# Patient Record
Sex: Male | Born: 2000 | Hispanic: No | Marital: Single | Smoking: Never smoker
Health system: Southern US, Community
[De-identification: ages and names within clinical notes are randomized; demographics above are authoritative.]

## PROBLEM LIST (undated history)

## (undated) DIAGNOSIS — T7840XA Allergy, unspecified, initial encounter: Secondary | ICD-10-CM

## (undated) HISTORY — DX: Allergy, unspecified, initial encounter: T78.40XA

---
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
## 2020-02-26 ENCOUNTER — Other Ambulatory Visit: Payer: Self-pay

## 2020-02-26 ENCOUNTER — Ambulatory Visit: Payer: BC Managed Care – PPO | Admitting: Medical

## 2020-02-26 VITALS — BP 119/71 | HR 81 | Temp 97.0°F | Resp 18 | Ht 72.0 in | Wt 191.0 lb

## 2020-02-26 DIAGNOSIS — L089 Local infection of the skin and subcutaneous tissue, unspecified: Secondary | ICD-10-CM

## 2020-02-26 DIAGNOSIS — L600 Ingrowing nail: Secondary | ICD-10-CM | POA: Diagnosis not present

## 2020-02-26 MED ORDER — CEPHALEXIN 500 MG PO CAPS: 500.0000 mg | ORAL_CAPSULE | Freq: Two times a day (BID) | ORAL | 0 refills | Status: DC

## 2020-02-26 NOTE — Patient Instructions (Addendum)
You do appear to have ingrown toe nail with infection. Want you to do warm salt water soaks twice daily, start keflex antibiotic and also use low dose ibuprofen 400-600 mg every 8 hours.  Update me on Friday morning if toe feels better. If not then will go ahead and refer you to podiatrist.   Follow up in 7-10 days for recheck if we don't need to refer you to podiatrist

## 2020-02-26 NOTE — Progress Notes (Signed)
Subjective:    Patient ID: Anthony Newton, male    DOB: 2001/04/15, 19 y.o.   MRN: [id number]  HPI  Pt in with rt great toe pain/lateral aspect swollen and tender for about 2 weeks ago. Pt states applying alcohol and neosporin to area.  No fever, no chills and no discharge.    Review of Systems  Constitutional: Negative for chills, diaphoresis, fatigue and fever.  Respiratory: Negative for cough, chest tightness, shortness of breath and wheezing.   Cardiovascular: Negative for chest pain and palpitations.  Skin: Negative for rash.       Rt toe pain.  Neurological: Negative for dizziness, speech difficulty, weakness, numbness and headaches.  Hematological: Negative for adenopathy. Does not bruise/bleed easily.  Psychiatric/Behavioral: Negative for behavioral problems and decreased concentration.    Past Medical History:  Diagnosis Date  . Allergy      Social History   Socioeconomic History  . Marital status: Single    Spouse name: Not on file  . Number of children: Not on file  . Years of education: Not on file  . Highest education level: Not on file  Occupational History  . Not on file  Tobacco Use  . Smoking status: Never Smoker  . Smokeless tobacco: Never Used  Substance and Sexual Activity  . Alcohol use: No  . Drug use: No  . Sexual activity: Not on file  Other Topics Concern  . Not on file  Social History Narrative  . Not on file   Social Determinants of Health   Financial Resource Strain:   . Difficulty of Paying Living Expenses:   Food Insecurity:   . Worried About Programme researcher, broadcasting/film/video in the Last Year:   . Barista in the Last Year:   Transportation Needs:   . Freight forwarder (Medical):   Marland Kitchen Lack of Transportation (Non-Medical):   Physical Activity:   . Days of Exercise per Week:   . Minutes of Exercise per Session:   Stress:   . Feeling of Stress :   Social Connections:   . Frequency of Communication with Friends and  Family:   . Frequency of Social Gatherings with Friends and Family:   . Attends Religious Services:   . Active Member of Clubs or Organizations:   . Attends Banker Meetings:   Marland Kitchen Marital Status:   Intimate Partner Violence:   . Fear of Current or Ex-Partner:   . Emotionally Abused:   Marland Kitchen Physically Abused:   . Sexually Abused:     No past surgical history on file.  Family History  Problem Relation Age of Onset  . Hypertension Mother   . Hypertension Father   . Breast cancer Maternal Grandmother     Not on File  Current Outpatient Medications on File Prior to Visit  Medication Sig Dispense Refill  . cephALEXin (KEFLEX) 500 MG capsule Take 1 capsule (500 mg total) by mouth 2 (two) times daily. (Patient not taking: Reported on 02/26/2020) 14 capsule 0  . moxifloxacin (VIGAMOX) 0.5 % ophthalmic solution Place 1 drop into the left eye 3 (three) times daily. If not covered well/too expensive then can switch to tobrex solution. 5 ml 1-2 drops every 6-8 hours. But please let us know. Thanks. (Patient not taking: Reported on 02/26/2020) 3 mL 0   No current facility-administered medications on file prior to visit.    BP 119/71 (BP Location: Left Arm, Patient Position: Sitting, Cuff Size: Large)  Pulse 81   Temp (!) 97 F (36.1 C) (Temporal)   Resp 18   Ht 6' (1.829 m)   Wt 191 lb (86.6 kg)   SpO2 100%   BMI 25.90 kg/m       Objective:   Physical Exam  General- no acute, no distress, pleasant. Rt foot- great toe lateral aspect swollen and tender mild. No dc.      Assessment & Plan:  You do appear to have ingrown toe nail with infection. Want you to do warm salt water soaks twice daily, start keflex antibiotic and also use low dose ibuprofen 400-600 mg evrery 8 hours.  Update me on Friday morning if toe feels better. If not then will go ahead and refer you to podiatrist.   Follow up in 7-10 days for recheck if we don't need to refer you to podiatrist   Time  spent with patient today was 20  minutes which consisted of  discussing diagnosis,  Treatment, plan going forward/potential referral  and documentation.

## 2020-11-27 ENCOUNTER — Ambulatory Visit (INDEPENDENT_AMBULATORY_CARE_PROVIDER_SITE_OTHER): Payer: BLUE CROSS/BLUE SHIELD | Admitting: Medical

## 2020-11-27 ENCOUNTER — Other Ambulatory Visit: Payer: Self-pay

## 2020-11-27 ENCOUNTER — Ambulatory Visit (HOSPITAL_BASED_OUTPATIENT_CLINIC_OR_DEPARTMENT_OTHER)
Admission: RE | Admit: 2020-11-27 | Discharge: 2020-11-27 | Disposition: A | Discharge status: Home / Self Care | Payer: BLUE CROSS/BLUE SHIELD | Source: Ambulatory Visit | Attending: Medical | Admitting: Medical

## 2020-11-27 ENCOUNTER — Encounter: Payer: Self-pay | Admitting: Medical

## 2020-11-27 VITALS — BP 100/80 | HR 84 | Temp 98.3°F | Resp 18 | Ht 72.0 in | Wt 196.2 lb

## 2020-11-27 DIAGNOSIS — M25571 Pain in right ankle and joints of right foot: Secondary | ICD-10-CM | POA: Diagnosis not present

## 2020-11-27 NOTE — Patient Instructions (Signed)
You have 1 week of new onset moderate ankle pain.  No particular injury but you do report walking a lot at work.  Pain is in the talofibular area, arch and on top of foot.  We will get x-ray of ankle today and recommend that you use Ace wrap day.  Ace wrap applied during office visit.  Recommend using ibuprofen 400 mg every 8 hours as needed.  Follow x-ray results and response to above treatment.  If pain persist would refer to sports medicine.  Follow-up in 7 to 10 days or as needed.

## 2020-11-27 NOTE — Progress Notes (Signed)
   Subjective:    Patient ID: Anthony Newton, male    DOB: [birth year], 19 y.o.   MRN: [id number]  HPI  Pt in with rt ankle pain. Pain for about one week or more.   No injury or fall. Just states started hurting. Pt states walks a lot at work but no exercise such as jogging or gong to gym.  Pt does not report any specific significant pain to ankle in past.  Pt notes some pain in lateral ankle, top and arch.      Review of Systems  Constitutional: Negative for chills, fatigue and fever.  Musculoskeletal:       Right ankle and foot pain.       Objective:   Physical Exam  General-no acute distress Right ankle-mild talofibular region tenderness to palpation.  Faint tenderness on  palpation on proximal aspect/ankle junction.  No obvious tenderness to palpation of arch.  No obvious swelling to foot and no warmth.  Pulses intact.      Assessment & Plan:  You have 1 week of new onset moderate ankle pain.  No particular injury but you do report walking a lot at work.  Pain is in the talofibular area, arch and on top of foot.  We will get x-ray of ankle today and recommend that you use Ace wrap day.  Ace wrap applied during office visit.  Recommend using ibuprofen 400 mg every 8 hours as needed.  Follow x-ray results and response to above treatment.  If pain persist would refer to sports medicine.  Follow-up in 7 to 10 days or as needed.

## 2020-12-02 ENCOUNTER — Telehealth: Payer: Self-pay | Admitting: Family Medicine

## 2020-12-02 NOTE — Telephone Encounter (Signed)
  Patient seen by Kristine Garbe on 11/27/2020 and advise to call office if ankle did not improve for referral. Patient states he is still in pain and would like a referral to orthopedic doctor.  Please advise

## 2020-12-02 NOTE — Addendum Note (Signed)
Addended by: Gwenevere Abbot on: 12/02/2020 11:31 AM   Modules accepted: Orders

## 2020-12-02 NOTE — Telephone Encounter (Signed)
I put in sports med referral . I think this will be quicker referral and Sports med can address nonnsurgical orthopedic problems often just as well and sometimes quicker referral. If he wants me to refer to ortho let me know.

## 2020-12-03 NOTE — Telephone Encounter (Signed)
Pt.notified

## 2020-12-11 ENCOUNTER — Ambulatory Visit: Payer: Self-pay

## 2020-12-11 ENCOUNTER — Encounter: Payer: Self-pay | Admitting: Family Medicine

## 2020-12-11 ENCOUNTER — Ambulatory Visit (INDEPENDENT_AMBULATORY_CARE_PROVIDER_SITE_OTHER): Payer: BC Managed Care – PPO | Admitting: Family Medicine

## 2020-12-11 ENCOUNTER — Other Ambulatory Visit: Payer: Self-pay

## 2020-12-11 VITALS — BP 116/80 | Ht 72.0 in | Wt 196.0 lb

## 2020-12-11 DIAGNOSIS — M84374A Stress fracture, right foot, initial encounter for fracture: Secondary | ICD-10-CM | POA: Diagnosis not present

## 2020-12-11 DIAGNOSIS — M79671 Pain in right foot: Secondary | ICD-10-CM

## 2020-12-11 NOTE — Progress Notes (Signed)
  Anthony Newton - 20 y.o. male MRN [id number]  Date of birth: Nov 30, 2000  SUBJECTIVE:  Including CC & ROS.  No chief complaint on file.   Anthony Newton is a 20 y.o. male that is presenting with right lateral midfoot pain.  The pain is ongoing over the past 2 to 3 weeks.  Seems to be worse after he has been working on his feet for a period of time.  Denies any injury or inciting event.  No history of injury or surgery.  Has not tried nothing for the pain..  Independent review of the right ankle x-ray from 2/9 shows possible effusion.   Review of Systems See HPI   HISTORY: Past Medical, Surgical, Social, and Family History Reviewed & Updated per EMR.   Pertinent Historical Findings include:  Past Medical History:  Diagnosis Date  . Allergy     No past surgical history on file.  Family History  Problem Relation Age of Onset  . Hypertension Mother   . Hypertension Father   . Breast cancer Maternal Grandmother     Social History   Socioeconomic History  . Marital status: Single    Spouse name: Not on file  . Number of children: Not on file  . Years of education: Not on file  . Highest education level: Not on file  Occupational History  . Not on file  Tobacco Use  . Smoking status: Never Smoker  . Smokeless tobacco: Never Used  Substance and Sexual Activity  . Alcohol use: No  . Drug use: No  . Sexual activity: Not on file  Other Topics Concern  . Not on file  Social History Narrative  . Not on file   Social Determinants of Health   Financial Resource Strain: Not on file  Food Insecurity: Not on file  Transportation Needs: Not on file  Physical Activity: Not on file  Stress: Not on file  Social Connections: Not on file  Intimate Partner Violence: Not on file     PHYSICAL EXAM:  VS: BP 116/80 (BP Location: Left Arm, Patient Position: Sitting, Cuff Size: Large)   Ht 6' (1.829 m)   Wt 196 lb (88.9 kg)   BMI 26.58 kg/m  Physical Exam Gen: NAD,  alert, cooperative with exam, well-appearing MSK:  Right left foot: No swelling or ecchymosis. Tenderness palpation of the midfoot. Normal range of motion. Normal strength resistance. Neurovascular intact  Limited ultrasound: Right foot, left foot:  Right foot:  No effusion of the ankle joint. No change at the base of the first metatarsal. Increased hyperemia over the medial cuneiform which could represent a stress fracture. No changes at the base of the second metatarsal.  Left foot: No effusion of the ankle joint. No change of the base of the second. No changes of the navicular bone. No changes of the medial cuneiform. No changes at the base of the second metatarsal  Summary: Findings would suggest a stress reaction versus stress fracture of the medial cuneiform.  Ultrasound and interpretation by Clare Gandy, MD    ASSESSMENT & PLAN:   Stress fracture of right foot Symptoms been ongoing for the past 2 weeks.  Has increased hyperemia over the cuneiform which could represent a stress fracture. -Counseled on home exercise therapy and supportive care. -Postop shoe with scaphoid pad. -Could consider physical therapy or further imaging.

## 2020-12-11 NOTE — Assessment & Plan Note (Signed)
Symptoms been ongoing for the past 2 weeks.  Has increased hyperemia over the cuneiform which could represent a stress fracture. -Counseled on home exercise therapy and supportive care. -Postop shoe with scaphoid pad. -Could consider physical therapy or further imaging.

## 2020-12-11 NOTE — Patient Instructions (Signed)
Nice to meet you Please try ice  Please try the shoe  Please use ibuprofen or tylenol as needed  We can try an insole in the left shoe while the other foot is healing   Please send me a message in MyChart with any questions or updates.  Please see me back in 3 weeks.   --Dr. Jordan Likes

## 2020-12-31 ENCOUNTER — Ambulatory Visit (INDEPENDENT_AMBULATORY_CARE_PROVIDER_SITE_OTHER): Payer: BC Managed Care – PPO | Admitting: Family Medicine

## 2020-12-31 ENCOUNTER — Other Ambulatory Visit: Payer: Self-pay

## 2020-12-31 DIAGNOSIS — M84374D Stress fracture, right foot, subsequent encounter for fracture with routine healing: Secondary | ICD-10-CM | POA: Diagnosis not present

## 2020-12-31 NOTE — Patient Instructions (Signed)
Good to see you Please try the insoles  We could try a custom orthotic in the future   Please send me a message in MyChart with any questions or updates.  Please see me back in 4 weeks.   --Dr. Jordan Likes

## 2020-12-31 NOTE — Assessment & Plan Note (Addendum)
Has had improvement with post op shoe  - counseled on home exercise therapy and supportive care - green sport insole and scaphoid pads.  - could consider custom orthotics or further imaging.

## 2020-12-31 NOTE — Progress Notes (Signed)
  Anthony Newton - 20 y.o. male MRN [id number]  Date of birth: Nov 09, 2000  SUBJECTIVE:  Including CC & ROS.  No chief complaint on file.   Anthony Newton is a 20 y.o. male that is following up for his right foot pain. Has done well with the post op shoe. No pain currently.    Review of Systems See HPI   HISTORY: Past Medical, Surgical, Social, and Family History Reviewed & Updated per EMR.   Pertinent Historical Findings include:  Past Medical History:  Diagnosis Date  . Allergy     No past surgical history on file.  Family History  Problem Relation Age of Onset  . Hypertension Mother   . Hypertension Father   . Breast cancer Maternal Grandmother     Social History   Socioeconomic History  . Marital status: Single    Spouse name: Not on file  . Number of children: Not on file  . Years of education: Not on file  . Highest education level: Not on file  Occupational History  . Not on file  Tobacco Use  . Smoking status: Never Smoker  . Smokeless tobacco: Never Used  Substance and Sexual Activity  . Alcohol use: No  . Drug use: No  . Sexual activity: Not on file  Other Topics Concern  . Not on file  Social History Narrative  . Not on file   Social Determinants of Health   Financial Resource Strain: Not on file  Food Insecurity: Not on file  Transportation Needs: Not on file  Physical Activity: Not on file  Stress: Not on file  Social Connections: Not on file  Intimate Partner Violence: Not on file     PHYSICAL EXAM:  VS: BP 110/80 (BP Location: Left Arm, Patient Position: Sitting, Cuff Size: Normal)   Ht 6' (1.829 m)   Wt 196 lb (88.9 kg)   BMI 26.58 kg/m  Physical Exam Gen: NAD, alert, cooperative with exam, well-appearing MSK:  Right foot:  No swelling or ecchymosis  Normal range of motion  No tenderness to palpation.  Neurovascularly intact       ASSESSMENT & PLAN:   Stress fracture of right foot Has had improvement with  post op shoe  - counseled on home exercise therapy and supportive care - green sport insole and scaphoid pads.  - could consider custom orthotics or further imaging.

## 2021-01-28 ENCOUNTER — Other Ambulatory Visit: Payer: Self-pay

## 2021-01-28 ENCOUNTER — Ambulatory Visit (INDEPENDENT_AMBULATORY_CARE_PROVIDER_SITE_OTHER): Payer: BC Managed Care – PPO | Admitting: Family Medicine

## 2021-01-28 ENCOUNTER — Encounter: Payer: Self-pay | Admitting: Family Medicine

## 2021-01-28 DIAGNOSIS — M84374D Stress fracture, right foot, subsequent encounter for fracture with routine healing: Secondary | ICD-10-CM | POA: Diagnosis not present

## 2021-01-28 NOTE — Assessment & Plan Note (Signed)
Has been doing well since then insoles and additional padding was added. -Counseled on home exercise therapy and supportive care. -Could consider custom orthotics.

## 2021-01-28 NOTE — Progress Notes (Signed)
  Anthony Newton - 20 y.o. male MRN [id number]  Date of birth: December 31, 2000  SUBJECTIVE:  Including CC & ROS.  No chief complaint on file.   [name] is a 20 y.o. male that is following up for his foot pain.  He has been doing well since getting the Green sport insoles and the additional padding.   Review of Systems See HPI   HISTORY: Past Medical, Surgical, Social, and Family History Reviewed & Updated per EMR.   Pertinent Historical Findings include:  Past Medical History:  Diagnosis Date  . Allergy     No past surgical history on file.  Family History  Problem Relation Age of Onset  . Hypertension Mother   . Hypertension Father   . Breast cancer Maternal Grandmother     Social History   Socioeconomic History  . Marital status: Single    Spouse name: Not on file  . Number of children: Not on file  . Years of education: Not on file  . Highest education level: Not on file  Occupational History  . Not on file  Tobacco Use  . Smoking status: Never Smoker  . Smokeless tobacco: Never Used  Substance and Sexual Activity  . Alcohol use: No  . Drug use: No  . Sexual activity: Not on file  Other Topics Concern  . Not on file  Social History Narrative  . Not on file   Social Determinants of Health   Financial Resource Strain: Not on file  Food Insecurity: Not on file  Transportation Needs: Not on file  Physical Activity: Not on file  Stress: Not on file  Social Connections: Not on file  Intimate Partner Violence: Not on file     PHYSICAL EXAM:  VS: BP 120/74 (BP Location: Left Arm, Patient Position: Sitting, Cuff Size: Normal)   Ht 6' (1.829 m)   Wt 196 lb (88.9 kg)   BMI 26.58 kg/m  Physical Exam Gen: NAD, alert, cooperative with exam, well-appearing    ASSESSMENT & PLAN:   Stress fracture of right foot Has been doing well since then insoles and additional padding was added. -Counseled on home exercise therapy and supportive  care. -Could consider custom orthotics.

## 2021-05-06 ENCOUNTER — Encounter: Payer: Self-pay | Admitting: Family Medicine

## 2021-05-06 ENCOUNTER — Ambulatory Visit (INDEPENDENT_AMBULATORY_CARE_PROVIDER_SITE_OTHER): Payer: BC Managed Care – PPO | Admitting: Family Medicine

## 2021-05-06 ENCOUNTER — Other Ambulatory Visit: Payer: Self-pay

## 2021-05-06 DIAGNOSIS — M84374D Stress fracture, right foot, subsequent encounter for fracture with routine healing: Secondary | ICD-10-CM

## 2021-05-06 NOTE — Progress Notes (Signed)
  [name] - 20 y.o. male MRN [id number]  Date of birth: Mar 22, 2001  SUBJECTIVE:  Including CC & ROS.  No chief complaint on file.   Anthony Newton is a 20 y.o. male that is following up for his right foot pain.  He has cut back on the amount of working that he is doing.  His previous temporary insoles have helped with his pain.   Review of Systems See HPI   HISTORY: Past Medical, Surgical, Social, and Family History Reviewed & Updated per EMR.   Pertinent Historical Findings include:  Past Medical History:  Diagnosis Date   Allergy     History reviewed. No pertinent surgical history.  Family History  Problem Relation Age of Onset   Hypertension Mother    Hypertension Father    Breast cancer Maternal Grandmother     Social History   Socioeconomic History   Marital status: Single    Spouse name: Not on file   Number of children: Not on file   Years of education: Not on file   Highest education level: Not on file  Occupational History   Not on file  Tobacco Use   Smoking status: Never   Smokeless tobacco: Never  Substance and Sexual Activity   Alcohol use: No   Drug use: No   Sexual activity: Not on file  Other Topics Concern   Not on file  Social History Narrative   Not on file   Social Determinants of Health   Financial Resource Strain: Not on file  Food Insecurity: Not on file  Transportation Needs: Not on file  Physical Activity: Not on file  Stress: Not on file  Social Connections: Not on file  Intimate Partner Violence: Not on file     PHYSICAL EXAM:  VS: Ht 6' (1.829 m)   Wt 196 lb (88.9 kg)   BMI 26.58 kg/m  Physical Exam Gen: NAD, alert, cooperative with exam, well-appearing   Patient was fitted for a standard, cushioned, semi-rigid orthotic. The orthotic was heated and afterward the patient stood on the orthotic blank positioned on the orthotic stand. The patient was positioned in subtalar neutral position and 10 degrees  of ankle dorsiflexion in a weight bearing stance. After completion of molding, a stable base was applied to the orthotic blank. The blank was ground to a stable position for weight bearing. Size: 12 Pairs: 2 Base: Blue EVA Additional Posting and Padding: None The patient ambulated these, and they were very comfortable.    ASSESSMENT & PLAN:   Stress fracture of right foot Denies any significant pain today.  Reports he is spending less and is not working as much. -Counseled on home exercise therapy and supportive care. -Orthotics today. -Could consider adding scaphoid pads.

## 2021-05-06 NOTE — Assessment & Plan Note (Signed)
Denies any significant pain today.  Reports he is spending less and is not working as much. -Counseled on home exercise therapy and supportive care. -Orthotics today. -Could consider adding scaphoid pads.

## 2021-09-01 IMAGING — DX DG ANKLE COMPLETE 3+V*R*
3 series · 3 of 3 positions shown · non-contrast
Comparison: None.

CLINICAL DATA: Diffuse right ankle pain, no known injury

EXAM:
RIGHT ANKLE - COMPLETE 3+ VIEW

[ankle ap]
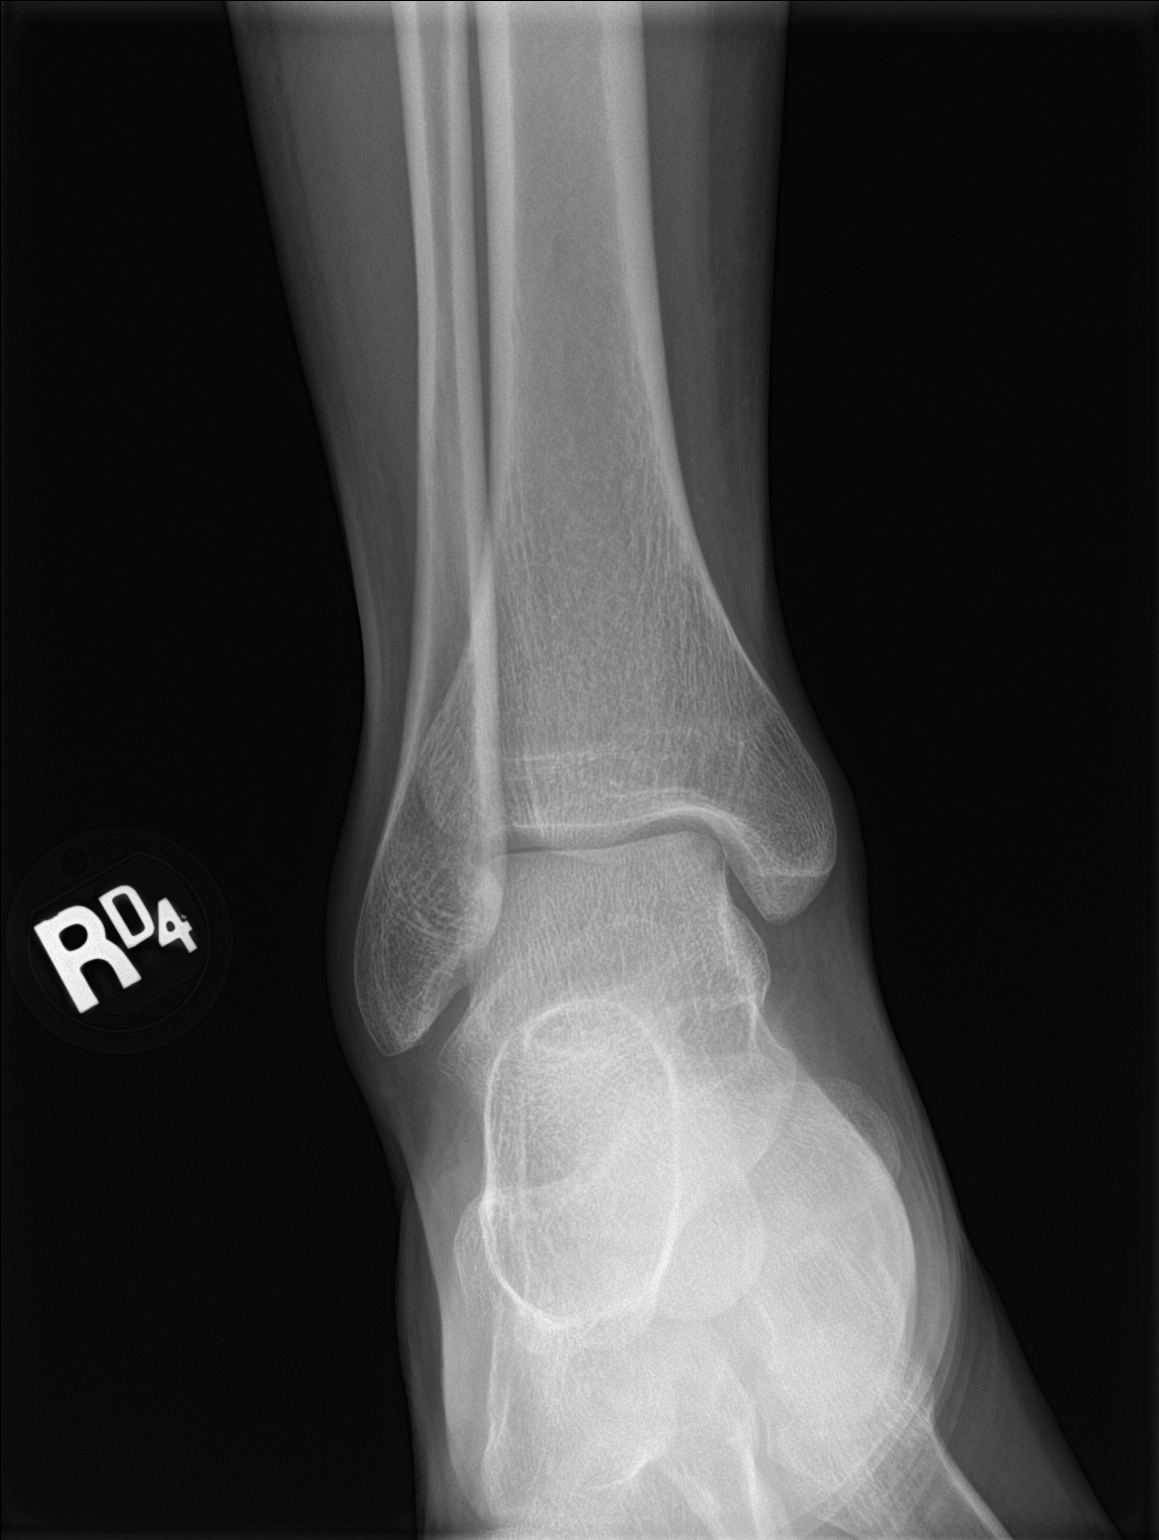

[ankle obl]
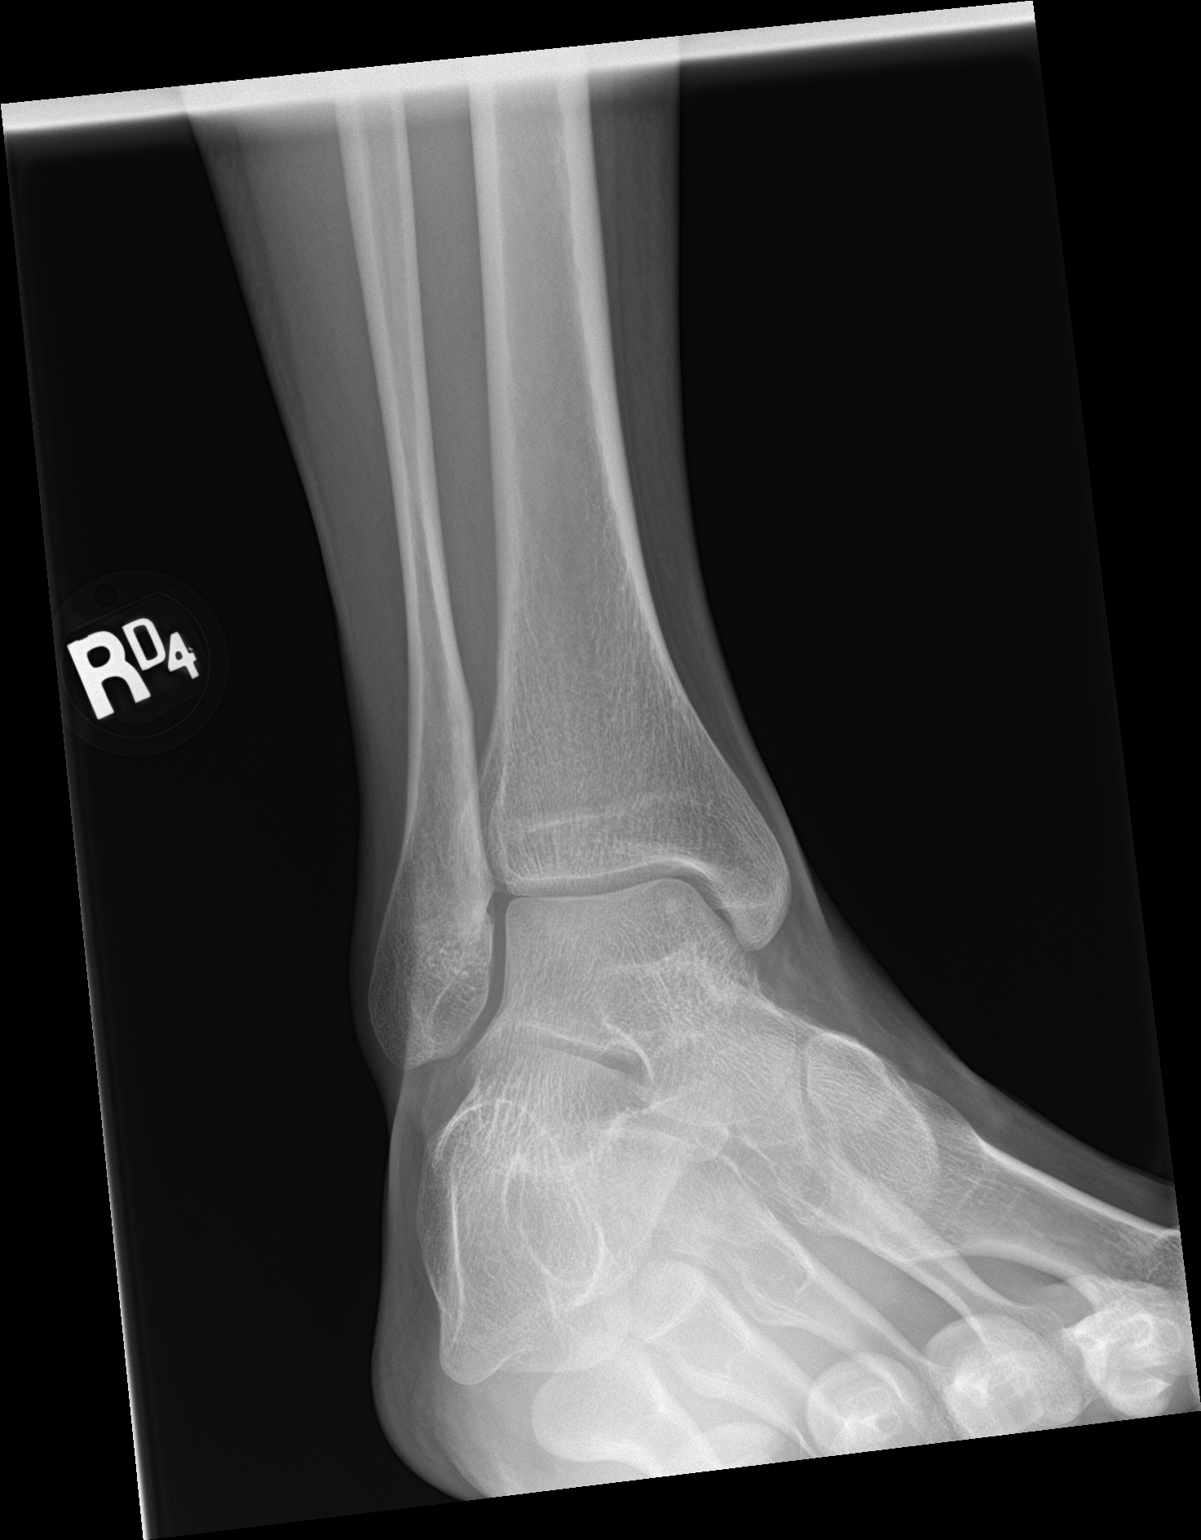

[ankle lat]
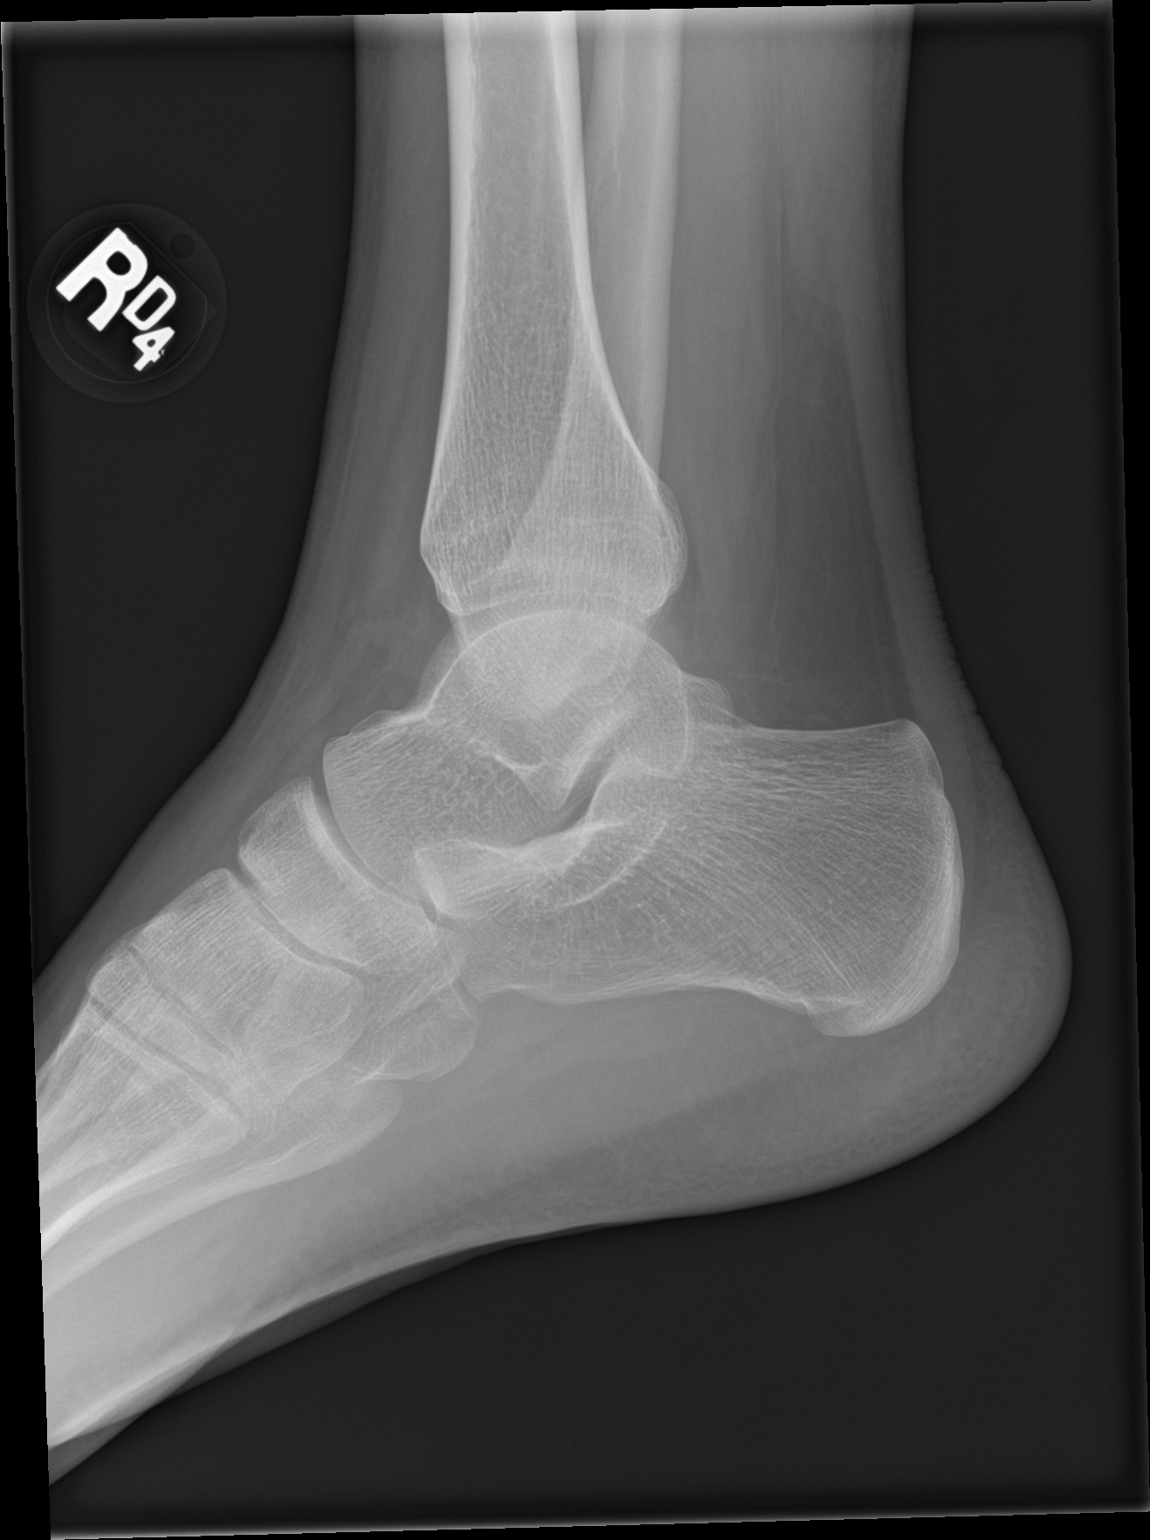

[3 of 3 positions shown; findings below may reference images not displayed]

FINDINGS: No significant swelling. Trace effusion. No acute bony abnormality.
Specifically, no fracture, subluxation, or dislocation. No evidence
of underlying arthropathy or age advanced degenerative change.
Normal bone mineralization.
IMPRESSION: No acute bony abnormality. Trace effusion.

## 2021-09-03 ENCOUNTER — Ambulatory Visit: Payer: BC Managed Care – PPO | Admitting: Podiatry

## 2021-09-08 ENCOUNTER — Encounter: Payer: Self-pay | Admitting: Podiatry

## 2021-09-08 ENCOUNTER — Ambulatory Visit (INDEPENDENT_AMBULATORY_CARE_PROVIDER_SITE_OTHER): Payer: BC Managed Care – PPO | Admitting: Podiatry

## 2021-09-08 ENCOUNTER — Ambulatory Visit (INDEPENDENT_AMBULATORY_CARE_PROVIDER_SITE_OTHER): Payer: BC Managed Care – PPO

## 2021-09-08 ENCOUNTER — Other Ambulatory Visit: Payer: Self-pay

## 2021-09-08 DIAGNOSIS — M79671 Pain in right foot: Secondary | ICD-10-CM

## 2021-09-08 DIAGNOSIS — M79672 Pain in left foot: Secondary | ICD-10-CM | POA: Diagnosis not present

## 2021-09-08 DIAGNOSIS — M779 Enthesopathy, unspecified: Secondary | ICD-10-CM | POA: Diagnosis not present

## 2021-09-08 NOTE — Progress Notes (Signed)
Subjective:   Patient ID: Anthony Newton, male   DOB: 20 y.o.   MRN: [id number]   HPI Patient presents stating his feet hurt and he had a stress fracture and had to go to the emergency room and had some temporary inserts putting them but they are not holding up his arch and he needs something for better arch support.  Patient does not smoke likes to be active and works weightbearing job cement floors   Review of Systems  All other systems reviewed and are negative.      Objective:  Physical Exam Vitals and nursing note reviewed.  Constitutional:      Appearance: He is well-developed.  Pulmonary:     Effort: Pulmonary effort is normal.  Musculoskeletal:        General: Normal range of motion.  Skin:    General: Skin is warm.  Neurological:     Mental Status: He is alert.    Neurovascular status intact muscle strength adequate range of motion adequate with moderate discomfort dorsum of both feet within the metatarsal shafts with moderate depression of the arch noted bilateral and found to have good digital perfusion well oriented x3     Assessment:  Acute tendinitis with possibility for stress fracture dorsal right foot and also discomfort left which stress fracture cannot be ruled out     Plan:  H&P reviewed patient and x-rays and at this point I have recommended a Berkley type orthotic device for deep heel seat offload weight off for his feet and take stress off.  Patient is casted for functional orthotics discussed ice therapy support therapy and shoe gear appropriateness for him to wear given his mild to moderate foot discomfort  X-rays were suspicious for fracture of the metatarsal shaft 3 left over right no acute fracture noted currently

## 2021-09-09 ENCOUNTER — Other Ambulatory Visit: Payer: Self-pay | Admitting: Podiatry

## 2021-09-09 DIAGNOSIS — M779 Enthesopathy, unspecified: Secondary | ICD-10-CM

## 2021-10-21 ENCOUNTER — Telehealth: Payer: Self-pay | Admitting: *Deleted

## 2021-10-21 ENCOUNTER — Telehealth: Payer: Self-pay | Admitting: Podiatry

## 2021-10-21 NOTE — Telephone Encounter (Signed)
Pt called stating it's been over a month and he hasn't heard back from the orthopaedic doctor. He would like to know if you can resend the referral. Please advise.

## 2021-10-28 ENCOUNTER — Ambulatory Visit: Payer: BC Managed Care – PPO

## 2021-10-28 ENCOUNTER — Other Ambulatory Visit: Payer: Self-pay

## 2021-10-28 DIAGNOSIS — M779 Enthesopathy, unspecified: Secondary | ICD-10-CM

## 2021-10-28 DIAGNOSIS — M79672 Pain in left foot: Secondary | ICD-10-CM

## 2021-10-28 NOTE — Progress Notes (Signed)
SITUATION: Reason for Visit: Fitting and Delivery of Custom Fabricated Foot Orthoses Patient Report: Patient reports comfort and is satisfied with device.  OBJECTIVE DATA: Patient History / Diagnosis:     ICD-10-CM   1. Foot pain, bilateral  M79.671    M79.672     2. Tendonitis  M77.9       Provided Device:  Functional foot orthotics  GOAL OF ORTHOSIS - Improve gait - Decrease energy expenditure - Improve Balance - Provide Triplanar stability of foot complex - Facilitate motion  ACTIONS PERFORMED Patient was fit with foot orthotics trimmed to shoe last. Patient tolerated fittign procedure. Device was modified as follows to better fit patient: - Toe plate was trimmed to shoe last  Patient was provided with verbal and written instruction and demonstration regarding donning, doffing, wear, care, proper fit, function, purpose, cleaning, and use of the orthosis and in all related precautions and risks and benefits regarding the orthosis.  Patient was also provided with verbal instruction regarding how to report any failures or malfunctions of the orthosis and necessary follow up care. Patient was also instructed to contact our office regarding any change in status that may affect the function of the orthosis.  Patient demonstrated independence with proper donning, doffing, and fit and verbalized understanding of all instructions.  PLAN: Patient is to follow up in one week or as necessary (PRN). All questions were answered and concerns addressed. Plan of care was discussed with and agreed upon by the patient.

## 2021-11-03 NOTE — Telephone Encounter (Signed)
Error message

## 2023-02-03 ENCOUNTER — Encounter: Payer: Self-pay | Admitting: *Deleted

## 2023-05-25 ENCOUNTER — Ambulatory Visit: Payer: BC Managed Care – PPO

## 2023-05-25 NOTE — Progress Notes (Signed)
Patient was here and new impressions taken for new CFO's.  Patient has not seen Dr. Since 2022, November and wants INS billed for new CFO's. I need to talk with Dr Charlsie Merles to see if he needs, to see Patient again.   Addison Bailey Cped, CFo, CFm

## 2023-06-08 NOTE — Progress Notes (Unsigned)
Spoke with Dr. Charlsie Merles he has Ok'd new pair of orthotics for patient   Patient was seen, measured for custom molded foot orthotics on 05/25/2023  Patient will benefit from CFO's as they will help provide total contact to MLA's helping to better distribute body weight across BIL feet greater reducing plantar pressure and pain and to also encourage FF and RF alignment  Patient was scanned items to be ordered and fit when in  Scans of impressions done today and sent to [facility] for fabrication  Addison Bailey Cped, CFo, CFm  Charges and financials need to be added

## 2023-06-24 ENCOUNTER — Ambulatory Visit (INDEPENDENT_AMBULATORY_CARE_PROVIDER_SITE_OTHER): Payer: BC Managed Care – PPO

## 2023-06-24 DIAGNOSIS — M79672 Pain in left foot: Secondary | ICD-10-CM | POA: Diagnosis not present

## 2023-06-24 DIAGNOSIS — M779 Enthesopathy, unspecified: Secondary | ICD-10-CM

## 2023-06-24 DIAGNOSIS — M79671 Pain in right foot: Secondary | ICD-10-CM | POA: Diagnosis not present

## 2023-06-24 NOTE — Progress Notes (Signed)
Patient presents today to pick up custom molded foot orthotics, diagnosed with tendinitis by Dr. Charlsie Merles .   Orthotics were dispensed and fit was satisfactory. Reviewed instructions for break-in and wear. Written instructions given to patient.  Patient will follow up as needed. Financials Signed today   Addison Bailey CPed, CFo, CFm

## 2023-07-27 ENCOUNTER — Ambulatory Visit: Payer: BC Managed Care – PPO | Admitting: Medical

## 2023-07-27 ENCOUNTER — Ambulatory Visit (HOSPITAL_BASED_OUTPATIENT_CLINIC_OR_DEPARTMENT_OTHER)
Admission: RE | Admit: 2023-07-27 | Discharge: 2023-07-27 | Disposition: A | Discharge status: Home / Self Care | Payer: BC Managed Care – PPO | Source: Ambulatory Visit | Attending: Medical | Admitting: Medical

## 2023-07-27 VITALS — BP 110/62 | HR 72 | Resp 18 | Ht 72.0 in | Wt 219.0 lb

## 2023-07-27 DIAGNOSIS — M79609 Pain in unspecified limb: Secondary | ICD-10-CM | POA: Insufficient documentation

## 2023-07-27 NOTE — Patient Instructions (Addendum)
1. Popliteal pain -will get [place] to rule out DVT. If negative the consider tendon pain or muscle strain to semitendinosis. If negative [place] can use tylenol 500 mg in combination with aleve over the counter. Also could use ace wrap or knee brace. If dvt will treat with appropitate med and make specialist referral. - US Venous Img Lower Unilateral Right; Future   If negative dvt and pain persisting despite above treatement for one week then refer to sports med MD or PT  Follow up 7-10 days or as needed

## 2023-07-27 NOTE — Progress Notes (Signed)
   Established Patient Office Visit  Subjective   Patient ID: ELDRICK PENICK, male    DOB: September 06, 2001  Age: 22 y.o. MRN: [id number]  Chief Complaint  Patient presents with   Leg Pain    Both     HPI    ROS    Objective:     BP 110/62   Pulse 72   Resp (!) 97   Ht 6' (1.829 m)   Wt 219 lb (99.3 kg)   BMI 29.70 kg/m    Physical Exam   No results found for any visits on 07/27/23.    The ASCVD Risk score (Arnett DK, et al., 2019) failed to calculate for the following reasons:   The 2019 ASCVD risk score is only valid for ages 15 to 33    Assessment & Plan:   Problem List Items Addressed This Visit   None Visit Diagnoses     Popliteal pain    -  Primary   Relevant Orders   US Venous Img Lower Unilateral Right       No follow-ups on file.    Maximino Sarin, CMA

## 2023-07-27 NOTE — Progress Notes (Signed)
   Subjective:    Patient ID: Anthony Newton, male    DOB: [birth year], 22 y.o.   MRN: [id number]  HPI  Pt in with rt leg pain for 3-4 days. Pt states pain came on working at [employer]. No fall or injury.    He notes pain after walking for 3 hours after waking and walking.  No sob or wheezing.  Pt has took ibuprofen 400 mg over the counter. He states did not help much.  Review of Systems See hpi    Objective:   Physical Exam  General- No acute distress. Pleasant patient. Neck- Full range of motion, no jvd Lungs- Clear, even and unlabored. Heart- regular rate and rhythm. Neurologic- CNII- XII grossly intact.  Rt leg- mild popliteal region pain on papation. Calf not swollen. Negative homans sign.     Assessment & Plan:   Patient Instructions  1. Popliteal pain -will get [place] to rule out DVT. If negative the consider tendon pain or muscle starni semitendinosis. If negative [place] can use tylenol 500 mg in combination with aleve over the counter. Also could use ace wrap or knee brace. If dvt will treat with appropitate med and make specialist referral. - US Venous Img Lower Unilateral Right; Future   If negative dvt and pain persisting despite above treatement for one week then refer to sports med MD or PT  Follow up 7-10 days or as needed   Pt cell [phone]

## 2024-05-02 ENCOUNTER — Ambulatory Visit (INDEPENDENT_AMBULATORY_CARE_PROVIDER_SITE_OTHER): Admitting: Medical

## 2024-05-02 VITALS — BP 118/90 | HR 94 | Temp 98.0°F | Resp 18 | Ht 73.0 in | Wt 226.0 lb

## 2024-05-02 DIAGNOSIS — Z0000 Encounter for general adult medical examination without abnormal findings: Secondary | ICD-10-CM

## 2024-05-02 DIAGNOSIS — Z23 Encounter for immunization: Secondary | ICD-10-CM

## 2024-05-02 LAB — COMPREHENSIVE METABOLIC PANEL WITH GFR
ALT: 62 U/L — ABNORMAL HIGH (ref 0–53)
AST: 29 U/L (ref 0–37)
Albumin: 5 g/dL (ref 3.5–5.2)
Alkaline Phosphatase: 56 U/L (ref 39–117)
BUN: 10 mg/dL (ref 6–23)
CO2: 31 meq/L (ref 19–32)
Calcium: 10.1 mg/dL (ref 8.4–10.5)
Chloride: 101 meq/L (ref 96–112)
Creatinine, Ser: 0.87 mg/dL (ref 0.40–1.50)
GFR: 120.86 mL/min (ref >60.00)
Glucose, Bld: 82 mg/dL (ref 70–99)
Potassium: 4.3 meq/L (ref 3.5–5.1)
Sodium: 139 meq/L (ref 135–145)
Total Bilirubin: 0.4 mg/dL (ref 0.2–1.2)
Total Protein: 7.6 g/dL (ref 6.0–8.3)

## 2024-05-02 LAB — CBC WITH DIFFERENTIAL/PLATELET
Basophils Absolute: 0 K/uL (ref 0.0–0.1)
Basophils Relative: 0.5 % (ref 0.0–3.0)
Eosinophils Absolute: 0.1 K/uL (ref 0.0–0.7)
Eosinophils Relative: 1.2 % (ref 0.0–5.0)
HCT: 46 % (ref 39.0–52.0)
Hemoglobin: 15 g/dL (ref 13.0–17.0)
Lymphocytes Relative: 23.3 % (ref 12.0–46.0)
Lymphs Abs: 2.1 K/uL (ref 0.7–4.0)
MCHC: 32.6 g/dL (ref 30.0–36.0)
MCV: 82 fl (ref 78.0–100.0)
Monocytes Absolute: 0.9 K/uL (ref 0.1–1.0)
Monocytes Relative: 9.7 % (ref 3.0–12.0)
Neutro Abs: 6 K/uL (ref 1.4–7.7)
Neutrophils Relative %: 65.3 % (ref 43.0–77.0)
Platelets: 290 K/uL (ref 150.0–400.0)
RBC: 5.61 Mil/uL (ref 4.22–5.81)
RDW: 13.4 % (ref 11.5–15.5)
WBC: 9.2 K/uL (ref 4.0–10.5)

## 2024-05-02 LAB — LIPID PANEL
Cholesterol: 181 mg/dL (ref 0–200)
HDL: 41.3 mg/dL (ref >39.00)
LDL Cholesterol: 109 mg/dL — ABNORMAL HIGH (ref 0–99)
NonHDL: 140.19
Total CHOL/HDL Ratio: 4
Triglycerides: 155 mg/dL — ABNORMAL HIGH (ref 0.0–149.0)
VLDL: 31 mg/dL (ref 0.0–40.0)

## 2024-05-02 NOTE — Progress Notes (Signed)
 Subjective:    Patient ID: Anthony Newton, male    DOB: Jun 22, 2001, 23 y.o.   MRN: [id number]  HPI  Pt in for wellness exam  Pt works at [employer] pick up. Pt not working out apart from walking his dogs in morning. Pt states eating healthy for most part. Non smoker. No alcohol. Caffeine/soda every once in a while.  Pt will get Td today.   Advised flu vaccine recommended sept or October    Review of Systems  Constitutional:  Negative for chills, fatigue and fever.  HENT:  Negative for congestion.   Respiratory:  Negative for cough, chest tightness and wheezing.   Cardiovascular:  Negative for chest pain and palpitations.  Gastrointestinal:  Negative for abdominal pain, constipation, nausea and vomiting.  Genitourinary:  Negative for dysuria and frequency.  Musculoskeletal:  Negative for back pain and myalgias.  Skin:  Negative for rash.  Neurological:  Negative for dizziness, weakness and numbness.  Hematological:  Negative for adenopathy. Does not bruise/bleed easily.  Psychiatric/Behavioral:  Negative for behavioral problems and dysphoric mood.     Past Medical History:  Diagnosis Date   Allergy      Social History   Socioeconomic History   Marital status: Single    Spouse name: Not on file   Number of children: Not on file   Years of education: Not on file   Highest education level: Not on file  Occupational History   Not on file  Tobacco Use   Smoking status: Never   Smokeless tobacco: Never  Substance and Sexual Activity   Alcohol use: No   Drug use: No   Sexual activity: Not on file  Other Topics Concern   Not on file  Social History Narrative   Not on file   Social Drivers of Health   Financial Resource Strain: Not on file  Food Insecurity: Not on file  Transportation Needs: Not on file  Physical Activity: Not on file  Stress: Not on file  Social Connections: Not on file  Intimate Partner Violence: Not on file    No past surgical  history on file.  Family History  Problem Relation Age of Onset   Hypertension Mother    Hypertension Father    Breast cancer Maternal Grandmother     No Known Allergies  No current outpatient medications on file prior to visit.   No current facility-administered medications on file prior to visit.    BP (!) 118/90   Pulse 94   Temp 98 F (36.7 C)   Resp 18   Ht 6' 1 (1.854 m)   Wt 226 lb (102.5 kg)   SpO2 95%   BMI 29.82 kg/m        Objective:   Physical Exam  General Mental Status- Alert. General Appearance- Not in acute distress.   Skin General: Color- Normal Color. Moisture- Normal Moisture.  Neck Carotid Arteries- Normal color. Moisture- Normal Moisture. No carotid bruits. No JVD.  Chest and Lung Exam Auscultation: Breath Sounds:-Normal.  Cardiovascular Auscultation:Rythm- Regular. Murmurs & Other Heart Sounds:Auscultation of the heart reveals- No Murmurs.  Abdomen Inspection:-Inspeection Normal. Palpation/Percussion:Note:No mass. Palpation and Percussion of the abdomen reveal- Non Tender, Non Distended + BS, no rebound or guarding.  Neurologic Cranial Nerve exam:- CN III-XII intact(No nystagmus), symmetric smile. Strength:- 5/5 equal and symmetric strength both upper and lower extremities.   Skin- small hyperpigmented area left lower back where tick was attached 6 years ago.(Advised post inflamatory hyperpigmentation.  Assessment & Plan:   For you wellness exam today I have ordered cbc, cmp and lipid panel.  Vaccine given today Td  Recommend exercise and healthy diet.  We will let you know lab results as they come in.  Follow up date appointment will be determined after lab review.    Sierra Spargo, PA-C

## 2024-05-02 NOTE — Addendum Note (Signed)
 Addended by: DORLENE CHIQUITA RAMAN on: 05/02/2024 01:04 PM   Modules accepted: Orders

## 2024-05-02 NOTE — Patient Instructions (Addendum)
 For you wellness exam today I have ordered cbc, cmp and lipid panel.  Vaccine given today Td  Recommend exercise and healthy diet.  We will let you know lab results as they come in.  Follow up date appointment will be determined after lab review.    Preventive Care 73-24 Years Old, Male Preventive care refers to lifestyle choices and visits with your health care provider that can promote health and wellness. Preventive care visits are also called wellness exams. What can I expect for my preventive care visit? Counseling During your preventive care visit, your health care provider may ask about your: Medical history, including: Past medical problems. Family medical history. Current health, including: Emotional well-being. Home life and relationship well-being. Sexual activity. Lifestyle, including: Alcohol, nicotine or tobacco, and drug use. Access to firearms. Diet, exercise, and sleep habits. Safety issues such as seatbelt and bike helmet use. Sunscreen use. Work and work Astronomer. Physical exam Your health care provider may check your: Height and weight. These may be used to calculate your BMI (body mass index). BMI is a measurement that tells if you are at a healthy weight. Waist circumference. This measures the distance around your waistline. This measurement also tells if you are at a healthy weight and may help predict your risk of certain diseases, such as type 2 diabetes and high blood pressure. Heart rate and blood pressure. Body temperature. Skin for abnormal spots. What immunizations do I need?  Vaccines are usually given at various ages, according to a schedule. Your health care provider will recommend vaccines for you based on your age, medical history, and lifestyle or other factors, such as travel or where you work. What tests do I need? Screening Your health care provider may recommend screening tests for certain conditions. This may include: Lipid and  cholesterol levels. Diabetes screening. This is done by checking your blood sugar (glucose) after you have not eaten for a while (fasting). Hepatitis B test. Hepatitis C test. HIV (human immunodeficiency virus) test. STI (sexually transmitted infection) testing, if you are at risk. Talk with your health care provider about your test results, treatment options, and if necessary, the need for more tests. Follow these instructions at home: Eating and drinking  Eat a healthy diet that includes fresh fruits and vegetables, whole grains, lean protein, and low-fat dairy products. Drink enough fluid to keep your urine pale yellow. Take vitamin and mineral supplements as recommended by your health care provider. Do not drink alcohol if your health care provider tells you not to drink. If you drink alcohol: Limit how much you have to 0-2 drinks a day. Know how much alcohol is in your drink. In the [place] one drink equals one 12 oz bottle of beer (355 mL), one 5 oz glass of wine (148 mL), or one 1 oz glass of hard liquor (44 mL). Lifestyle Brush your teeth every morning and night with fluoride toothpaste. Floss one time each day. Exercise for at least 30 minutes 5 or more days each week. Do not use any products that contain nicotine or tobacco. These products include cigarettes, chewing tobacco, and vaping devices, such as e-cigarettes. If you need help quitting, ask your health care provider. Do not use drugs. If you are sexually active, practice safe sex. Use a condom or other form of protection to prevent STIs. Find healthy ways to manage stress, such as: Meditation, yoga, or listening to music. Journaling. Talking to a trusted person. Spending time with friends and family. Minimize  exposure to UV radiation to reduce your risk of skin cancer. Safety Always wear your seat belt while driving or riding in a vehicle. Do not drive: If you have been drinking alcohol. Do not ride with someone who  has been drinking. If you have been using any mind-altering substances or drugs. While texting. When you are tired or distracted. Wear a helmet and other protective equipment during sports activities. If you have firearms in your house, make sure you follow all gun safety procedures. Seek help if you have been physically or sexually abused. What's next? Go to your health care provider once a year for an annual wellness visit. Ask your health care provider how often you should have your eyes and teeth checked. Stay up to date on all vaccines. This information is not intended to replace advice given to you by your health care provider. Make sure you discuss any questions you have with your health care provider. Document Revised: 04/02/2021 Document Reviewed: 04/02/2021 Elsevier Patient Education  2024 [employer].

## 2024-05-03 ENCOUNTER — Ambulatory Visit: Payer: Self-pay | Admitting: Medical

## 2025-06-05 ENCOUNTER — Encounter: Admitting: Medical
